# Patient Record
Sex: Male | Born: 1950 | Race: White | Hispanic: No | Marital: Single
Health system: Southern US, Community
[De-identification: ages and names within clinical notes are randomized; demographics above are authoritative.]

## PROBLEM LIST (undated history)

## (undated) DIAGNOSIS — I2510 Atherosclerotic heart disease of native coronary artery without angina pectoris: Secondary | ICD-10-CM

## (undated) DIAGNOSIS — J189 Pneumonia, unspecified organism: Secondary | ICD-10-CM

## (undated) DIAGNOSIS — M1990 Unspecified osteoarthritis, unspecified site: Secondary | ICD-10-CM

## (undated) DIAGNOSIS — D649 Anemia, unspecified: Secondary | ICD-10-CM

## (undated) DIAGNOSIS — I509 Heart failure, unspecified: Secondary | ICD-10-CM

## (undated) DIAGNOSIS — F419 Anxiety disorder, unspecified: Secondary | ICD-10-CM

## (undated) DIAGNOSIS — J449 Chronic obstructive pulmonary disease, unspecified: Secondary | ICD-10-CM

## (undated) HISTORY — PX: HAND SURGERY: SHX662

## (undated) HISTORY — PX: PARTIAL GASTRECTOMY: SHX2172

---

## 2007-04-19 ENCOUNTER — Ambulatory Visit: Payer: Self-pay | Admitting: Thoracic Surgery

## 2007-05-01 ENCOUNTER — Ambulatory Visit (HOSPITAL_COMMUNITY): Admission: RE | Admit: 2007-05-01 | Discharge: 2007-05-01 | Payer: Self-pay | Admitting: Thoracic Surgery

## 2007-05-01 ENCOUNTER — Encounter (INDEPENDENT_AMBULATORY_CARE_PROVIDER_SITE_OTHER): Payer: Self-pay | Admitting: Interventional Radiology

## 2007-05-03 ENCOUNTER — Ambulatory Visit: Payer: Self-pay | Admitting: Thoracic Surgery

## 2007-05-29 ENCOUNTER — Observation Stay (HOSPITAL_COMMUNITY): Admission: RE | Admit: 2007-05-29 | Discharge: 2007-05-29 | Payer: Self-pay | Admitting: Cardiovascular Disease

## 2007-06-30 ENCOUNTER — Ambulatory Visit (HOSPITAL_COMMUNITY): Admission: RE | Admit: 2007-06-30 | Discharge: 2007-06-30 | Payer: Self-pay | Admitting: Cardiovascular Disease

## 2007-07-25 ENCOUNTER — Observation Stay (HOSPITAL_COMMUNITY): Admission: RE | Admit: 2007-07-25 | Discharge: 2007-07-26 | Payer: Self-pay | Admitting: Cardiovascular Disease

## 2010-08-18 NOTE — Cardiovascular Report (Signed)
NAMESEAMUS, WAREHIME NO.:  [id number]   MEDICAL RECORD NO.:  [id number]          PATIENT TYPE:  OBV   LOCATION:  2899                         FACILITY:  [facility]   PHYSICIAN:  Nanetta Batty, M.D.   DATE OF BIRTH:  [birth year]   DATE OF PROCEDURE:  DATE OF DISCHARGE:                            CARDIAC CATHETERIZATION   HISTORY OF PRESENT ILLNESS:  Mr. Sauls is a very pleasant 60 year old  married white male, father of three stepchildren whose aunt is a patient  of mine.  He is referred through the courtesy of Dr. Lorin Picket for  cardiovascular evaluation.  He has been diagnosed with occluded left  subclavian artery as well as lower extremity claudication.  He has a 60  pack year history of tobacco abuse with COPD.  He does get occasional  chest tightness, but dobutamine rest/stress Myoview is negative.  He  does have Doppler evidence of left iliac and left renal artery stenosis.  He presents now for angiography of his subclavian renal and lower  extremity arteries to define his anatomy and potentially perform  endovascular therapy.   DESCRIPTION OF PROCEDURE:  The patient was brought to the second floor  [facility] angiographic suite in the postabsorptive state.  He was  premedicated with p.o. Valium.  His right groin was prepped and shaved  in usual sterile fashion, and 1% Xylocaine was used for local  anesthesia.  A 5-French sheath was inserted into the right femoral  artery using standard Seldinger technique.  A 5-French pigtail catheter  was used for arch angiography, and distal abdominal aortography.  Selective right innominate, right subclavian, left renal artery  angiography were performed.  Visipaque dye was used through the entirety  of the case.  Aortic pressures monitored in the case.   ANGIOGRAPHIC RESULTS:  1. Arterial aortogram; type 1 arch, 99% left subclavian artery      stenosis of the long segment subtotal occlusion.  There is no left  vertebral filling.  The delayed imaging of the left subclavian with      right innominate injection.  2. Abdominal aortogram.  Renal arteries 75% inferior pole accessory      left renal artery stenosis with significant amount of damping.  The      infrarenal abdominal aorta showed moderate atherosclerosis distally      just before the bifurcation.  3. Left lower extremity, 95% ostial left common iliac artery stenosis.  4. Right lower extremity; the right iliac artery is widely patent.   IMPRESSION:  Mr. Metayer has a subtotally occluded left subclavian.  I  cannot see the origin of the left vertebral, and therefore, I did not  feel comfortable exploring or intervening on this at this time.  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  Will continue to follow this noninvasively.  Does  have a high-grade left iliac artery stenosis which I plan on intervening  on in a staged fashion given the amount of contrast used today.   The sheath was removed and pressure applied and achieved good  hemostasis.  The patient left lab in stable condition.  He will be  hydrated for the next 5 hours and remain recumbent and will be  discharged home after that.  Will see him back in the office in  approximately one week for follow-up.      Nanetta Batty, M.D.  Electronically Signed     JB/MEDQ  D:  05/29/2007  T:  05/29/2007  Job:  [id number]   cc:   2nd Floor [facility]  [facility] [place] Dr. Lucila Maine

## 2010-08-18 NOTE — Letter (Signed)
April 19, 2007   Gary [name], M.D.  [address]  [place], [place]. [address]   Re:  Gary, [name]              DOB:  1951-03-11   Dear Dr. Blenda [name]:   I saw the patient back in the office today The patient is disabled  because of severe lung disease, although he continues to smoke and he is  on oxygen.  Pulmonary function tests reveal an FVC of 2.87 or 69% or  predicted with an FEV1 of 1.01 or 30% of predicted.  His DLCO is  markedly reduced at 5% of predicted.  He had a CT scan done in 2007  which showed chronic obstructive pulmonary disease and bilateral pleural  disease, now has a CT with a right upper lobe infiltrative-type lesion  which is present at the right apex.  The bronchoscopy was non  diagnostic.  A PET scan was done which showed some increased uptake of  5.8 in the right upper lobe.  This was done at [facility] which is very  hard for [place] to interpret over here because of their disk that they send.  It is not as user friendly as the [facility] PET scan.  He has had no  hemoptysis, fevers, chills, excessive sputum.   His other medications include Advair, Spiriva, he has been using  intermittent Avelox.  He is on prednisone, folic acid, and albuterol.  He is disabled for severe obstructive pulmonary disease.   FAMILY HISTORY:  Noncontributory.   SOCIAL HISTORY:  He is single and has 3 children, is disabled, smokes 1  pack a day, and drinks alcohol on a regular basis.   REVIEW OF SYSTEMS:  He is 130 pounds, he is 5 feet 9 inches.  He wears  oxygen.  CARDIAC:  He has some chest tightness but no atrial fibrillation or  heart murmur.  PULMONARY:  He has had no hemoptysis, but has been treated for  bronchitis, chronic coughing and wheezing.  GI:  No nausea, vomiting, constipation, or diarrhea.  GU:  No kidney disease, dysuria or frequent urination.  VASCULAR:  He has claudication, DVT or TAs.  NEUROLOGICAL:  No dizziness, headaches, blackouts, seizures.  MUSCULOSKELETAL:  No arthritis or joint pain.  PSYCHIATRIC:  He has been treated for nervousness and depression.  EYES/ENT:  No changes in his eyesight or hearing.  HEMATOLOGICAL:  No problems with bleeding or clotting disorder.   PHYSICAL EXAMINATION:  He is an ill-appearing Caucasian male in no acute  distress.  His blood pressure was 139/79 and pulse 100, respirations 20,  sats were 96% on 2 L of oxygen.  Head, eyes, ears, nose, and throat are  unremarkable.  Neck is supple without thyromegaly.  There is no  supraclavicular or axial adenopathy.  Chest is clear to auscultation and  percussion.  Heart rate is bk , no murmurs.  On the chest there is  decreased breath sounds on the right side, particularly.  Abdomen is  soft, there is no hepatosplenomegaly.  Pulses are 1+, there is no  clubbing or edema.   I discussed the situation with him.  He said that you had mentioned  possibly doing a VATS approach.  I think this would be very hard to do a  biopsy because of his thickened pleura.  He has a calcification in the  pleura.  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  I think the best thing to try is a  needle biopsy, and I will go ahead and schedule him for a needle biopsy  of this right upper lobe mass.  I think the differential, as you  suspected, is either between infiltrative lung cancer versus some type  of chronic inflammatory process, either from atypical TB or fungus.  I  will let you know the findings of our workup.   Gary [name], M.D.  Electronically Signed   DPB/MEDQ  D:  04/19/2007  T:  04/19/2007  Job:  [id number]

## 2010-08-18 NOTE — Cardiovascular Report (Signed)
NAMEBARNABY, RIPPEON NO.:  [id number]   MEDICAL RECORD NO.:  [id number]          PATIENT TYPE:  OUT   LOCATION:  CATS                         FACILITY:  [facility]   PHYSICIAN:  Nanetta Batty, M.D.   DATE OF BIRTH:  1950-04-29   DATE OF PROCEDURE:  DATE OF DISCHARGE:  06/30/2007                            CARDIAC CATHETERIZATION   Gary [name] is a 60 year old married white male, father of three,  referred by Gary [name] for cardiovascular and peripheral vascular  evaluation.  He is found to have left upper and lower extremity stenosis  as well as left renal artery stenosis.  He complains of left upper  extremity claudication and weakness to his left leg.  Angiography  performed on May 29, 2007, revealed 99% long left subclavian  stenosis.  I could not visualize the origin of the vertebrae, and a CT  angiogram suggested that was occluded.  Myoview stress test was  negative.  He presents now for angiography and intervention of his left  subclavian and iliac artery.   PROCEDURE DESCRIPTION:  The patient was brought to the second floor of  [facility]D [facility] Suite in postabsorptive state.  He was  premedicated with IV Versed and fentanyl.  His left groin was prepped  and shaved in usual sterile fashion.  Xylocaine 1% was used for local  anesthesia.  A 7-French 30-cm long bright tip sheath was then inserted  into the left femoral artery using standard Seldinger technique.  The  patient received a total of 4000 units of heparin intravenously.  Visipaque dye was used for the entirety of the case.  Retrograde aortic  pressure was monitored during the case.  A Wholey wire crossed the  iliac, and PTA was performed initially with a 0.062 Powerflex allowing  the sheath to cross without obstructing the vessel.  Following this, a 5-  [place] long right Judkins catheter was used for selective right  vertebral angiography and left vertebral angiography.   ANGIOGRAPHIC  RESULTS:  Right vertebral angiography revealed a 40-50%  ostial stenosis.  The __________  filled  posterior circulation with  efflux into the left basilar artery.  Retrograde flow was not seen down  to the subclavian.  Angiography of the left subclavian revealed to be  99% stenosed.  The [facility] wire would not cross, and therefore a long  0.035 angled Glidewire was used to cross the lesion.  A 135-cm long  Quick-Cross catheter was then used to cross the lesion, and a Rosen wire  was exchanged.  The sheath was then exchanged for a 7-French 30-cm long  bright tip sheath.  Dr. Jacinto Halim assisted during the case.   Using a 4 x 4 Powerflex, predilatation was performed.  The subclavian  was then stented using a EV3 Protege nitinol self-expanding stent (8 x  4) precisely hitting the ostium of the vessel.  This was dilated with a  0.074 Powerflex throughout its entirety, and the ostium was flared with  an 0.082 Powerflex resulting in reduction of 99% stenosis to 0%  residual.  There was antegrade flow up and down the IMA.  The sheath was then withdrawn into the aorta and exchanged for the 30-cm  long 8-French.  The origin of the iliac was then stented with a 7 x 28  OmniLink and postdilated with an 0.082 Powerflex resulting reduction of  90% stenosis to 0% residual.  There was small linear dissection that was  contained within the stent.  The patient tolerated the procedure well.  The guidewire was removed.  ACT was measured at 220.  The sheath will be  removed once the  ACT falls below 170.  The patient left the office in stable condition.  He will be gently hydrated overnight, treated with aspirin and Plavix.  He was discharged home, and we will get upper and lower extremity  Doppler studies, after which he will see me back in followup.  He left  lab in stable condition.      Nanetta Batty, M.D.  Electronically Signed     JB/MEDQ  D:  07/25/2007  T:  07/26/2007  Job:  [id number]   cc:    Lucila Maine, M.D  Second Floor [facility]D angiographic Suite  [facility]

## 2010-08-18 NOTE — Letter (Signed)
May 03, 2007   Tanvir A. Blenda Nicely, M.D.  [address]  [place], [place]. [address]   Re:  Gary [name], Gary [name]              DOB:  Sep 10, 1950   Dear Dr. Blenda Nicely:   I saw Gary [name] back for followup. We did a needle biopsy of the right  upper lobe mass and it turned out to be chronic inflammation with  multiple histocytes and multi-enucleated cells. There is no evidence of  cancer. He is still on oxygen and breathing poorly, but since there is  no evidence of cancer in the needle biopsy, I think we can just safely  follow him and I will see him back again in three months with another CT  scan. I do not think we have any other options given his lung capacity.  His blood pressure was 135/77, pulse 100, respirations 22, sats were 92%  on oxygen. Lungs showed distant breath sounds.   Ines Bloomer, M.D.  Electronically Signed   DPB/MEDQ  D:  05/03/2007  T:  05/03/2007  Job:  [id number]

## 2010-08-21 NOTE — Discharge Summary (Signed)
Gary [name], Gary [name] NO.:  [id number]   MEDICAL RECORD NO.:  [id number]          PATIENT TYPE:  OBV   LOCATION:  6525                         FACILITY:  [facility]   PHYSICIAN:  Gary [name], M.D.   DATE OF BIRTH:  May 27, 1950   DATE OF ADMISSION:  07/25/2007  DATE OF DISCHARGE:  07/26/2007                               DISCHARGE SUMMARY   DISCHARGE DIAGNOSES:  1. Peripheral vascular disease, status post elective left subclavian      and left common iliac artery angioplasty and stenting this      admission.  2. History of smoking.  3. Negative Myoview with normal LV function, January 2009.  4. Residual 75% left renal artery stenosis.   HOSPITAL COURSE:  Gary [name] is a 60 year old male followed by Dr. Lorin [name]  whose aunt sees Dr. Allyson [name] as well.  Gary [name] was referred back to Dr.  Allyson [name] for peripheral vascular evaluation.  He was found to have left  upper and lower extremity stenosis as well as left renal artery  stenosis.  He does have left upper extremity claudication and weakness  in his left leg.  Angiography performed in February 2009 revealed a 99%  stenosis of his left subclavian, 75% accessory left renal artery  narrowing, and a 90% stenosis of the left common iliac.  CT scan was  done as an outpatient.  He was seen by Dr. Allyson [name] on July 20, 2007 and  Dr. Allyson [name] felt it would be best to proceed with elective intervention.  The patient was admitted on July 25, 2007 for peripheral angiogram.  This showed 90% left common iliac artery, 99% left subclavian artery,  and 40-50% right vertebral artery.  The patient underwent intervention  to the subclavian and common iliac on the left, tolerated this well.  I  felt he could be discharged on July 26, 2007.  He will follow up with  Dr. Allyson [name] after discharge in a couple weeks.   DISCHARGE MEDICATIONS:  1. DuoNeb as needed.  2. Spiriva 18 mcg a day.  3. Advair Diskus 250/50 b.i.d.  4. Folic acid.  5. Aspirin 325  mg a day.  6. Plavix 75 mg a day.   Labs done preop show white count of 5.3, hemoglobin 14, hematocrit 39.5,  and platelets 239.  INR 0.8, BUN 11, creatinine 1.2, sodium 140, and  potassium 4.4.  Urinalysis is unremarkable.  TSH 2.1.  EKG shows sinus  rhythm without acute changes.   DISPOSITION:  The patient is discharged in stable condition and will  follow up with Dr. Allyson [name] as in outpatient.      Abelino Derrick, P.A.      Gary [name], M.D.  Electronically Signed    LKK/MEDQ  D:  08/23/2007  T:  08/24/2007  Job:  [id number]   cc:   Heywood Bene, MD

## 2010-12-25 LAB — BASIC METABOLIC PANEL
BUN: 10
Chloride: 102
GFR calc non Af Amer: >60
Glucose, Bld: 74
Potassium: 4.1

## 2010-12-25 LAB — CBC
HCT: 41.2
Hemoglobin: 13.7
MCHC: 33.2
MCV: 96.1
RDW: 15.8 — ABNORMAL HIGH

## 2010-12-29 LAB — CBC
MCHC: 34.3
MCV: 94.8
Platelets: 218

## 2010-12-29 LAB — BASIC METABOLIC PANEL
BUN: 9
CO2: 27
Calcium: 9.3
Creatinine, Ser: 1.32
Glucose, Bld: 91

## 2014-03-20 ENCOUNTER — Inpatient Hospital Stay (HOSPITAL_COMMUNITY): Payer: Medicare FFS

## 2014-03-20 ENCOUNTER — Inpatient Hospital Stay (HOSPITAL_COMMUNITY)
Admission: EM | Admit: 2014-03-20 | Discharge: 2014-04-05 | DRG: 871 | Disposition: E | Payer: Medicare FFS | Source: Other Acute Inpatient Hospital | Attending: Internal Medicine | Admitting: Internal Medicine

## 2014-03-20 ENCOUNTER — Encounter (HOSPITAL_COMMUNITY): Payer: Self-pay | Admitting: Acute Care

## 2014-03-20 DIAGNOSIS — M1990 Unspecified osteoarthritis, unspecified site: Secondary | ICD-10-CM | POA: Diagnosis present

## 2014-03-20 DIAGNOSIS — J441 Chronic obstructive pulmonary disease with (acute) exacerbation: Secondary | ICD-10-CM | POA: Diagnosis present

## 2014-03-20 DIAGNOSIS — Z681 Body mass index (BMI) 19 or less, adult: Secondary | ICD-10-CM

## 2014-03-20 DIAGNOSIS — J156 Pneumonia due to other aerobic Gram-negative bacteria: Secondary | ICD-10-CM | POA: Diagnosis present

## 2014-03-20 DIAGNOSIS — G9341 Metabolic encephalopathy: Secondary | ICD-10-CM | POA: Diagnosis present

## 2014-03-20 DIAGNOSIS — I25119 Atherosclerotic heart disease of native coronary artery with unspecified angina pectoris: Secondary | ICD-10-CM

## 2014-03-20 DIAGNOSIS — Y95 Nosocomial condition: Secondary | ICD-10-CM | POA: Diagnosis present

## 2014-03-20 DIAGNOSIS — E872 Acidosis: Secondary | ICD-10-CM | POA: Diagnosis present

## 2014-03-20 DIAGNOSIS — Z7952 Long term (current) use of systemic steroids: Secondary | ICD-10-CM | POA: Diagnosis not present

## 2014-03-20 DIAGNOSIS — Z515 Encounter for palliative care: Secondary | ICD-10-CM

## 2014-03-20 DIAGNOSIS — A419 Sepsis, unspecified organism: Secondary | ICD-10-CM | POA: Diagnosis present

## 2014-03-20 DIAGNOSIS — I2510 Atherosclerotic heart disease of native coronary artery without angina pectoris: Secondary | ICD-10-CM | POA: Diagnosis present

## 2014-03-20 DIAGNOSIS — R739 Hyperglycemia, unspecified: Secondary | ICD-10-CM | POA: Diagnosis present

## 2014-03-20 DIAGNOSIS — J189 Pneumonia, unspecified organism: Secondary | ICD-10-CM

## 2014-03-20 DIAGNOSIS — E43 Unspecified severe protein-calorie malnutrition: Secondary | ICD-10-CM | POA: Diagnosis present

## 2014-03-20 DIAGNOSIS — R6521 Severe sepsis with septic shock: Secondary | ICD-10-CM | POA: Diagnosis present

## 2014-03-20 DIAGNOSIS — J9621 Acute and chronic respiratory failure with hypoxia: Secondary | ICD-10-CM | POA: Diagnosis present

## 2014-03-20 DIAGNOSIS — J439 Emphysema, unspecified: Secondary | ICD-10-CM

## 2014-03-20 DIAGNOSIS — J9622 Acute and chronic respiratory failure with hypercapnia: Secondary | ICD-10-CM | POA: Diagnosis present

## 2014-03-20 DIAGNOSIS — J9620 Acute and chronic respiratory failure, unspecified whether with hypoxia or hypercapnia: Secondary | ICD-10-CM

## 2014-03-20 DIAGNOSIS — F419 Anxiety disorder, unspecified: Secondary | ICD-10-CM | POA: Diagnosis present

## 2014-03-20 DIAGNOSIS — E875 Hyperkalemia: Secondary | ICD-10-CM | POA: Diagnosis present

## 2014-03-20 DIAGNOSIS — R0602 Shortness of breath: Secondary | ICD-10-CM | POA: Diagnosis present

## 2014-03-20 DIAGNOSIS — D649 Anemia, unspecified: Secondary | ICD-10-CM | POA: Diagnosis present

## 2014-03-20 DIAGNOSIS — I2609 Other pulmonary embolism with acute cor pulmonale: Secondary | ICD-10-CM | POA: Diagnosis present

## 2014-03-20 DIAGNOSIS — G9340 Encephalopathy, unspecified: Secondary | ICD-10-CM

## 2014-03-20 DIAGNOSIS — Z66 Do not resuscitate: Secondary | ICD-10-CM

## 2014-03-20 DIAGNOSIS — Z9981 Dependence on supplemental oxygen: Secondary | ICD-10-CM

## 2014-03-20 DIAGNOSIS — J432 Centrilobular emphysema: Secondary | ICD-10-CM

## 2014-03-20 HISTORY — DX: Anemia, unspecified: D64.9

## 2014-03-20 HISTORY — DX: Anxiety disorder, unspecified: F41.9

## 2014-03-20 HISTORY — DX: Atherosclerotic heart disease of native coronary artery without angina pectoris: I25.10

## 2014-03-20 HISTORY — DX: Heart failure, unspecified: I50.9

## 2014-03-20 HISTORY — DX: Pneumonia, unspecified organism: J18.9

## 2014-03-20 HISTORY — DX: Chronic obstructive pulmonary disease, unspecified: J44.9

## 2014-03-20 HISTORY — DX: Unspecified osteoarthritis, unspecified site: M19.90

## 2014-03-20 LAB — CBC WITH DIFFERENTIAL/PLATELET
Basophils Absolute: 0 10*3/uL (ref 0.0–0.1)
Basophils Relative: 0 % (ref 0–1)
EOS PCT: 0 % (ref 0–5)
Eosinophils Absolute: 0 10*3/uL (ref 0.0–0.7)
HEMATOCRIT: 22.5 % — AB (ref 39.0–52.0)
Hemoglobin: 6.7 g/dL — CL (ref 13.0–17.0)
Lymphocytes Relative: 5 % — ABNORMAL LOW (ref 12–46)
Lymphs Abs: 0.4 10*3/uL — ABNORMAL LOW (ref 0.7–4.0)
MCH: 29.8 pg (ref 26.0–34.0)
MCHC: 29.8 g/dL — ABNORMAL LOW (ref 30.0–36.0)
MCV: 100 fL (ref 78.0–100.0)
Monocytes Absolute: 0.1 10*3/uL (ref 0.1–1.0)
Monocytes Relative: 1 % — ABNORMAL LOW (ref 3–12)
NEUTROS PCT: 94 % — AB (ref 43–77)
NRBC: 10 /100{WBCs} — AB
Neutro Abs: 7.3 10*3/uL (ref 1.7–7.7)
Platelets: 161 10*3/uL (ref 150–400)
RBC: 2.25 MIL/uL — AB (ref 4.22–5.81)
RDW: 20.3 % — ABNORMAL HIGH (ref 11.5–15.5)
WBC: 7.8 10*3/uL (ref 4.0–10.5)

## 2014-03-20 LAB — COMPREHENSIVE METABOLIC PANEL
ALBUMIN: 1 g/dL — AB (ref 3.5–5.2)
ALK PHOS: 84 U/L (ref 39–117)
ALT: 18 U/L (ref 0–53)
ANION GAP: 9 (ref 5–15)
AST: 25 U/L (ref 0–37)
BILIRUBIN TOTAL: 0.3 mg/dL (ref 0.3–1.2)
BUN: 39 mg/dL — AB (ref 6–23)
CHLORIDE: 107 meq/L (ref 96–112)
CO2: 26 meq/L (ref 19–32)
CREATININE: 0.71 mg/dL (ref 0.50–1.35)
Calcium: 6.6 mg/dL — ABNORMAL LOW (ref 8.4–10.5)
GFR calc Af Amer: >90 mL/min (ref >90)
GFR calc non Af Amer: >90 mL/min (ref >90)
Glucose, Bld: 168 mg/dL — ABNORMAL HIGH (ref 70–99)
Potassium: 5.2 mEq/L (ref 3.7–5.3)
Sodium: 142 mEq/L (ref 137–147)
Total Protein: 3.2 g/dL — ABNORMAL LOW (ref 6.0–8.3)

## 2014-03-20 LAB — BLOOD GAS, ARTERIAL
ACID-BASE EXCESS: 0 mmol/L (ref 0.0–2.0)
Bicarbonate: 26.1 mEq/L — ABNORMAL HIGH (ref 20.0–24.0)
Drawn by: 24610
FIO2: 0.8 %
O2 Saturation: 21.5 %
PEEP: 5 cmH2O
Patient temperature: 97.3
RATE: 20 resp/min
TCO2: 27.9 mmol/L (ref 0–100)
VT: 580 mL
pCO2 arterial: 56.2 mmHg — ABNORMAL HIGH (ref 35.0–45.0)
pH, Arterial: 7.284 — ABNORMAL LOW (ref 7.350–7.450)

## 2014-03-20 LAB — HEMOGLOBIN A1C
Hgb A1c MFr Bld: 6.7 % — ABNORMAL HIGH (ref <5.7)
MEAN PLASMA GLUCOSE: 146 mg/dL — AB (ref <117)

## 2014-03-20 LAB — PROTIME-INR
INR: 1.28 (ref 0.00–1.49)
PROTHROMBIN TIME: 16.1 s — AB (ref 11.6–15.2)

## 2014-03-20 LAB — PRO B NATRIURETIC PEPTIDE: PRO B NATRI PEPTIDE: 4984 pg/mL — AB (ref 0–125)

## 2014-03-20 LAB — TROPONIN I: Troponin I: <0.3 ng/mL (ref <0.30)

## 2014-03-20 LAB — GLUCOSE, CAPILLARY
GLUCOSE-CAPILLARY: 108 mg/dL — AB (ref 70–99)
Glucose-Capillary: 130 mg/dL — ABNORMAL HIGH (ref 70–99)

## 2014-03-20 LAB — LACTIC ACID, PLASMA: Lactic Acid, Venous: 3.9 mmol/L — ABNORMAL HIGH (ref 0.5–2.2)

## 2014-03-20 LAB — STREP PNEUMONIAE URINARY ANTIGEN: Strep Pneumo Urinary Antigen: NEGATIVE

## 2014-03-20 LAB — TYPE AND SCREEN
ABO/RH(D): O POS
Antibody Screen: POSITIVE
DAT, IgG: NEGATIVE
PT AG Type: NEGATIVE

## 2014-03-20 LAB — PROCALCITONIN: PROCALCITONIN: 53.81 ng/mL

## 2014-03-20 LAB — MAGNESIUM: Magnesium: 3.1 mg/dL — ABNORMAL HIGH (ref 1.5–2.5)

## 2014-03-20 LAB — APTT: aPTT: 33 seconds (ref 24–37)

## 2014-03-20 LAB — MRSA PCR SCREENING: MRSA BY PCR: NEGATIVE

## 2014-03-20 LAB — PHOSPHORUS: Phosphorus: 3.2 mg/dL (ref 2.3–4.6)

## 2014-03-20 MED ORDER — MORPHINE SULFATE 2 MG/ML IJ SOLN: 2.0000 mg | INTRAMUSCULAR | Status: DC | PRN

## 2014-03-20 MED ORDER — PRO-STAT SUGAR FREE PO LIQD
30.0000 mL | Freq: Two times a day (BID) | ORAL | Status: DC
  Filled 2014-03-20: qty 30

## 2014-03-20 MED ORDER — VITAL HIGH PROTEIN PO LIQD
1000.0000 mL | ORAL | Status: DC
  Filled 2014-03-20 (×2): qty 1000

## 2014-03-20 MED ORDER — HYDROCORTISONE NA SUCCINATE PF 100 MG IJ SOLR
50.0000 mg | Freq: Four times a day (QID) | INTRAMUSCULAR | Status: DC
  Filled 2014-03-20 (×3): qty 1

## 2014-03-20 MED ORDER — DEXTROSE 5 % IV SOLN
2.0000 g | Freq: Three times a day (TID) | INTRAVENOUS | Status: DC
  Filled 2014-03-20 (×2): qty 2

## 2014-03-20 MED ORDER — NOREPINEPHRINE BITARTRATE 1 MG/ML IV SOLN
2.0000 ug/min | INTRAVENOUS | Status: DC
  Administered 2014-03-20: 50 ug/min via INTRAVENOUS
  Filled 2014-03-20: qty 4

## 2014-03-20 MED ORDER — BUDESONIDE 0.5 MG/2ML IN SUSP
0.5000 mg | Freq: Two times a day (BID) | RESPIRATORY_TRACT | Status: DC
  Filled 2014-03-20 (×2): qty 2

## 2014-03-20 MED ORDER — HEPARIN SODIUM (PORCINE) 5000 UNIT/ML IJ SOLN: 5000.0000 [IU] | Freq: Three times a day (TID) | INTRAMUSCULAR | Status: DC

## 2014-03-20 MED ORDER — PANTOPRAZOLE SODIUM 40 MG IV SOLR: 40.0000 mg | Freq: Every day | INTRAVENOUS | Status: DC

## 2014-03-20 MED ORDER — SODIUM CHLORIDE 0.9 % IV SOLN
0.0300 [IU]/min | INTRAVENOUS | Status: DC
  Administered 2014-03-20: 0.03 [IU]/min via INTRAVENOUS
  Filled 2014-03-20: qty 2

## 2014-03-20 MED ORDER — ARFORMOTEROL TARTRATE 15 MCG/2ML IN NEBU
15.0000 ug | INHALATION_SOLUTION | Freq: Two times a day (BID) | RESPIRATORY_TRACT | Status: DC
  Administered 2014-03-20: 15 ug via RESPIRATORY_TRACT
  Filled 2014-03-20 (×2): qty 2

## 2014-03-20 MED ORDER — LEVOFLOXACIN IN D5W 750 MG/150ML IV SOLN: 750.0000 mg | INTRAVENOUS | Status: DC

## 2014-03-20 MED ORDER — FENTANYL CITRATE 0.05 MG/ML IJ SOLN: 50.0000 ug | Freq: Once | INTRAMUSCULAR | Status: DC

## 2014-03-20 MED ORDER — SODIUM CHLORIDE 0.9 % IV SOLN
INTRAVENOUS | Status: DC
  Administered 2014-03-20: 17:00:00 via INTRAVENOUS

## 2014-03-20 MED ORDER — NOREPINEPHRINE BITARTRATE 1 MG/ML IV SOLN
0.0000 ug/min | INTRAVENOUS | Status: DC
  Filled 2014-03-20: qty 16

## 2014-03-20 MED ORDER — FENTANYL BOLUS VIA INFUSION
50.0000 ug | INTRAVENOUS | Status: DC | PRN
  Filled 2014-03-20: qty 50

## 2014-03-20 MED ORDER — CETYLPYRIDINIUM CHLORIDE 0.05 % MT LIQD: 7.0000 mL | Freq: Four times a day (QID) | OROMUCOSAL | Status: DC

## 2014-03-20 MED ORDER — VITAL AF 1.2 CAL PO LIQD
1000.0000 mL | ORAL | Status: DC
  Filled 2014-03-20 (×2): qty 1000

## 2014-03-20 MED ORDER — CHLORHEXIDINE GLUCONATE 0.12 % MT SOLN: 15.0000 mL | Freq: Two times a day (BID) | OROMUCOSAL | Status: DC

## 2014-03-20 MED ORDER — INSULIN ASPART 100 UNIT/ML ~~LOC~~ SOLN: 0.0000 [IU] | SUBCUTANEOUS | Status: DC

## 2014-03-20 MED ORDER — FENTANYL CITRATE 0.05 MG/ML IJ SOLN
25.0000 ug/h | INTRAMUSCULAR | Status: DC
  Administered 2014-03-20: 50 ug/h via INTRAVENOUS
  Filled 2014-03-20: qty 50

## 2014-03-20 MED ORDER — PRO-STAT SUGAR FREE PO LIQD
30.0000 mL | Freq: Two times a day (BID) | ORAL | Status: DC
  Filled 2014-03-20 (×2): qty 30

## 2014-03-20 MED ORDER — SODIUM CHLORIDE 0.9 % IV SOLN: 250.0000 mL | INTRAVENOUS | Status: DC | PRN

## 2014-03-21 LAB — LEGIONELLA ANTIGEN, URINE

## 2014-03-21 LAB — URINE CULTURE
Colony Count: NO GROWTH
Culture: NO GROWTH
Special Requests: NORMAL

## 2014-03-22 LAB — PATHOLOGIST SMEAR REVIEW

## 2014-03-25 LAB — CULTURE, RESPIRATORY: Special Requests: NORMAL

## 2014-03-25 LAB — CULTURE, RESPIRATORY W GRAM STAIN

## 2014-03-26 LAB — CULTURE, BLOOD (ROUTINE X 2): CULTURE: NO GROWTH

## 2014-03-27 NOTE — Discharge Summary (Signed)
Gary [name] was a 63 y.o. male [facility] on 05[facility]Nov-2013.  He had progressive cough and dyspnea from AECOPD and PNA.  He required intubation for respiratory failure and pressors for septic shock.  He was continued on broad spectrum antibiotics.  [facility] held.  Gary [name] had very poor quality of life prior to this illness.  Family agreed to DNR status.  After further discussion, the family came to accept that he could not survive this illness.  They opted to implement comfort measures only.  He was removed from ventilatory support and subsequently expired on 05-Nov-2013 at 1942.  Final Diagnoses: 1) acute on chronic hypoxic/hypercapneic respiratory failure 2) HCAP with Enterobacter cloacae, and Serratia marcescens 3) AECOPD 4) Septic shock 5) Hx of CAD 6) Hyperkalemia 7) severe protein calorie malnutrition 8) anemia of critical illness 9) hyperglycemia 10) acute metabolic encephalopathy 11) Lactic acidosis 12) Tobacco abuse 13) acute on chronic cor pulmonale  Gary HellingVineet Jazir Newey, MD [facility] Pulmonary/Critical Care 03/27/2014, 11:16 AM

## 2014-04-05 NOTE — Progress Notes (Signed)
150 cc fentanyl wasted in sink with Jodene NamMonica Cross, RN

## 2014-04-05 NOTE — Progress Notes (Signed)
Maggie with lab called a critical hemoglobin of 6.7. Value readback and affirmed. Value given to Dr. Craige CottaSood. Will continue with current plan of care.

## 2014-04-05 NOTE — Progress Notes (Signed)
eLink Physician-Brief Progress Note Patient Name: Bland SpanDarrell P Coen DOB: 11/02/1950 MRN: [id number]   Date of Service  March 30, 2014  HPI/Events of Note  Order for levophed written (was on this on arrival from OSH)  eICU Interventions       Intervention Category Major Interventions: Shock - evaluation and management  MCQUAID, DOUGLAS March 30, 2014, 4:37 PM

## 2014-04-05 NOTE — Significant Event (Signed)
Met with pt's mother and sister.  Updated them about pt's condition.  They understand the severity of his illnesses, and understand he might not survive this illness.  They would like to continue current therapies, but agree to not escalate care if his medical condition gets worse.  They also confirmed DNR status.  Chesley Mires, MD [facility] Pulmonary/Critical Care 04/16/14, 4:44 PM Pager:  [phone] After 3pm call: [phone]

## 2014-04-05 NOTE — H&P (Signed)
PULMONARY / CRITICAL CARE MEDICINE   Name: Gary [name] MRN: [id number] DOB: 05/11/1950    ADMISSION DATE:  2013-12-05  REFERR[facility] MD :  [facility]   CHIEF COMPLAINT:  Acute on chronic respiratory failure   INITIAL PRESENTATION:  This is a 64 year old male w/ known h/o O2 and steroid dependent COPD, who [facility]16 w/ CC: 1 d h/o cough and progressive shortness of breath. EMS called. On arrival he was agonal and therefore intubated. Initial CXR showed bilateral infiltrates, [facility] for further evaluation and treatment.   STUDIES:    SIGNIFICANT EVENTS:    HISTORY OF PRESENT ILLNESS:   See above   PAST MEDICAL HISTORY :   has a past medical history of CAD (coronary artery disease); CHF (congestive heart failure); COPD, very severe; Pneumonia; Osteoarthritis; Anxiety; and Anemia.  has past surgical history that includes Hand surgery and Partial gastrectomy. Prior to Admission medications      Allergies not on file  FAMILY HISTORY:  has no family status information on file.  SOCIAL HISTORY:    REVIEW OF SYSTEMS:  Unable   SUBJECTIVE:   VITAL SIGNS: Temp:  [97 F (36.1 C)] 97 F (36.1 C) (12/16 1457) FiO2 (%):  [80 %] 80 % (12/16 1453) HEMODYNAMICS:   VENTILATOR SETTINGS: Vent Mode:  [-] PRVC FiO2 (%):  [80 %] 80 % Set Rate:  [20 bmp] 20 bmp Vt Set:  [400 mL] 400 mL PEEP:  [5 cmH20] 5 cmH20 Plateau Pressure:  [16 cmH20] 16 cmH20 INTAKE / OUTPUT: No intake or output data in the 24 hours ending 08/11/13 1515  PHYSICAL EXAMINATION: General:  Chronically ill appearing 64 year old male who appears much older than stated age Neuro:  Opens eyes to voice, nods appropriately, generalized weakness  HEENT:  Poor dentition, no clear JVD Cardiovascular:  rrr Lungs: barreled chest.  Rales R>L base. No sig wheeze.  Abdomen:  Non-tender, + bowel sounds, no OM  Musculoskeletal:  Muscle weakness. Frail  Skin:  Diffuse ecchymosis  over entire body w/ multiple skin tears. Both feet are mottled and cold to touch. Unable to palpate pulses.   LABS:  CBC No results for input(s): WBC, HGB, HCT, PLT in the last 168 hours. Coag's No results for input(s): APTT, INR in the last 168 hours. BMET No results for input(s): NA, K, CL, CO2, BUN, CREATININE, GLUCOSE in the last 168 hours. Electrolytes No results for input(s): CALCIUM, MG, PHOS in the last 168 hours. Sepsis Markers No results for input(s): LATICACIDVEN, PROCALCITON, O2SATVEN in the last 168 hours. ABG No results for input(s): PHART, PCO2ART, PO2ART in the last 168 hours. Liver Enzymes No results for input(s): AST, ALT, ALKPHOS, BILITOT, ALBUMIN in the last 168 hours. Cardiac Enzymes No results for input(s): TROPONINI, PROBNP in the last 168 hours. Glucose  Recent Labs Lab 08/11/13 1454  GLUCAP 130*    Imaging No results found.   ASSESSMENT / PLAN:  PULMONARY OETT 12/16>>> A: Acute on chronic hypoxic/hypercarbic respiratory failure in setting of bilateral pulmonary infiltrates  CAP ES COPD  P:   Full vent support  Scheduled BDs w/ ICS See ID section  PAD protocol   CARDIOVASCULAR CVL right fem CVL 12/16>>>  A: Circulatory shock. Presumed sepsis. Also consider element of volume depletion, +/- vent interaction  P:  Repeat lactic acid, will speak w/ family re: how aggressive we should be. If LA elevated may need to consider IJ to assure adequate  volume resuscitation efforts.  Cont tele Cont IVFs Ck lactic acid  Cont levophed for MAP > 65 Stress dose steroids  RENAL A: possible hyperkalemia (labs note hemolysis) P:   Cont IVFs Repeat chemistry   GASTROINTESTINAL A:   Probable protein cal malnutrition  P:   Start tube feeds Nutritional consult HEMATOLOGIC A:   Anemia of unclear chronicity  P:  Ck Occult blood Trend CBC scds  INFECTIOUS A:   R/o CAP P:   BCx2  12/16>>> UC 12/16>>> Sputum 12/16>>> RVP 12/16>>> Abx:  azactam 12/16>>> ABX levaquin 12/16>>>  ENDOCRINE A:   Hyperglycemia  P:   ssi   NEUROLOGIC A:   Acute encephalopathy  P:   RASS goal: -2 PAD protocol    FAMILY  - Updates:   - Inter-disciplinary family meet or Palliative Care meeting due by: 12/23    TODAY'S NP SUMMARY:  This is a 64 year old male w/ chronic resp failure in setting of copd. He is O2 and pred dependent at baseline. Admitting to MICU w/ what appears to be acute on chronic hypoxic and hypercarbic resp failure in setting of what is likely a CAP. He is also in shock, Presume sepsis. Appears chronically ill. Not sure he will survive this illness. Will cont full vent support, pan culture, cont IVFs, pressors and add stress dose steroids all in addition to empiric abx. Will need goals of care discussion ASAP.   Simonne MartinetPeter E Babcock ACNP-BC [facility] Pulmonary/Critical Care Pager # [phone] OR # [phone] if no answer      2013/12/03, 3:15 PM   Reviewed above, examined.  64 [facility] with PNA, VDRF, and septic shock.  He has hx of severe COPD with emphysema, and severe protein calorie malnutrition.    He remains on vent.  He has decreased breath sounds, very diminished muscle mass, mottled extremities.  He remains on full vent support and pressors.  He remains in extremis, with persistent hypotension.    Discussed with pt's sister/POA Tresa Endo(Kelly) over the phone.  She is in agreement to DNR status in the event of cardiac arrest, and maintain current therapies for now but not escalate care.  He is not likely to survive this illness.  Will continue to update family about goals of care, but most likely best option will be to transition to comfort measures.   CC time by me independent of APP and resident time is 40 minutes.  Coralyn HellingVineet Dianelys Scinto, MD [facility] Pulmonary/Critical Care 2013/12/03, 3:49 PM Pager:  [phone] After 3pm call: [phone]

## 2014-04-05 NOTE — Progress Notes (Addendum)
Pt is in refractory shock. Had goals of care discussion over phone w/ his sister Urbano HeirKelly Julian [phone], his POA. Told her that she is actively dying in spite of current interventions. She verbalized an understanding and agreed to DNR status.  Plan Continue current full vent support Continue pressors, volume resuscitation, and stress dose steroids Repeat admitting labs Cont abx  DNR  She is aware that he may not survive the afternoon.   Simonne MartinetPeter E Tyra Michelle ACNP-BC [facility] Pulmonary/Critical Care Pager # [phone] OR # [phone] if no answer

## 2014-04-05 NOTE — Progress Notes (Signed)
UR Completed.  [phone]  

## 2014-04-05 NOTE — Progress Notes (Signed)
Attempted femoral ABG x 2 RT's.  Results called to Dr. Kendrick FriesMcQuaid d/t ? Venous results.  Per MD, these results are venous.  Per MD, no need to attempt ABG again, no new vent orders rec'd.

## 2014-04-05 NOTE — Progress Notes (Signed)
Patient went into [facility] at 1942.  No pulse palpable.  Pronounced by Lasandra BeechAyana Adir Schicker, RN and Jodene NamMonica Cross, RN.  Sister and brother in law at bedside.  MD notified.

## 2014-04-05 NOTE — Progress Notes (Signed)
INITIAL NUTRITION ASSESSMENT  DOCUMENTATION CODES Per approved criteria  -Severe malnutrition in the context of chronic illness   Pt meets criteria for severe MALNUTRITION in the context of chronic illness as evidenced by severe depletion of muscle and subcutaneous fat mass.  INTERVENTION:  Initiate TF via OGT with Vital AF 1.2 at 25 ml/h and Prostat 30 ml BID on day 1; on day 2, d/c Prostat and increase to goal rate of 50 ml/h (1200 ml per day) to provide 1440 kcals, 90 gm protein, 973 ml free water daily. Monitor magnesium, potassium, and phosphorus daily for at least 3 days, MD to replete as needed, as pt is at risk for refeeding syndrome given severe PCM.  NUTRITION DIAGNOSIS: Inadequate oral intake related to inability to eat as evidenced by NPO status.   Goal: Intake to meet >90% of estimated nutrition needs.  Monitor:  TF tolerance/adequacy, weight trend, labs, vent status.  Reason for Assessment: MD Consult for TF initiation and management.  64 y.o. male  Admitting Dx: Acute on chronic respiratory failure  ASSESSMENT: 64 year old male w/ known h/o O2 and steroid dependent COPD, who [facility] w/ CC: 1 d h/o cough and progressive shortness of breath. On arrival he was intubated. Initial CXR showed bilateral infiltrates, [facility] and [facility].   Nutrition Focused Physical Exam:  Subcutaneous Fat:  Orbital Region: NA Upper Arm Region: severe depletion Thoracic and Lumbar Region: NA  Muscle:  Temple Region: moderate depletion Clavicle Bone Region: mild depletion Clavicle and Acromion Bone Region: mild depletion Scapular Bone Region: NA Dorsal Hand: NA Patellar Region: severe depletion Anterior Thigh Region: severe depletion Posterior Calf Region: severe depletion  Edema: none  Patient is currently intubated on ventilator support MV: 9.8 L/min Temp (24hrs), Avg:97 F (36.1 C), Min:97 F (36.1 C),  Max:97 F (36.1 C)   Height: Ht Readings from Last 1 Encounters:  No data found for Ht  RN to measure patient today. Estimate 5'6"  Weight: Wt Readings from Last 1 Encounters:  11/14/2013 98 lb 8 oz (44.679 kg)    Ideal Body Weight: 64.5 kg  % Ideal Body Weight: 69%  Wt Readings from Last 10 Encounters:  11/14/2013 98 lb 8 oz (44.679 kg)    Usual Body Weight: unknown  % Usual Body Weight: N/A  BMI:  There is no height on file to calculate BMI.  Estimated Nutritional Needs: Kcal: 1415 Protein: 75-85 gm Fluid: 1.5 L  Skin: no issues documented  Diet Order: Diet NPO time specified  EDUCATION NEEDS: -Education not appropriate at this time  No intake or output data in the 24 hours ending 11/14/2013 1617  Last BM: NA   Labs:  No results for input(s): NA, K, CL, CO2, BUN, CREATININE, CALCIUM, MG, PHOS, GLUCOSE in the last 168 hours.  CBG (last 3)   Recent Labs  11/14/2013 1454  GLUCAP 130*    Scheduled Meds: . antiseptic oral rinse  7 mL Mouth Rinse QID  . arformoterol  15 mcg Nebulization Q12H  . aztreonam  2 g Intravenous Q8H  . budesonide (PULMICORT) nebulizer solution  0.5 mg Nebulization BID  . chlorhexidine  15 mL Mouth Rinse BID  . feeding supplement (PRO-STAT SUGAR FREE 64)  30 mL Per Tube BID  . feeding supplement (VITAL HIGH PROTEIN)  1,000 mL Per Tube Q24H  . fentaNYL  50 mcg Intravenous Once  . heparin  5,000 Units Subcutaneous 3 times per day  .  hydrocortisone sod succinate (SOLU-CORTEF) inj  50 mg Intravenous Q6H  . insulin aspart  0-15 Units Subcutaneous 6 times per day  . levofloxacin (LEVAQUIN) IV  750 mg Intravenous Q24H  . pantoprazole (PROTONIX) IV  40 mg Intravenous QHS    Continuous Infusions: . sodium chloride    . fentaNYL infusion INTRAVENOUS 50 mcg/hr (10-01-13 1611)  . vasopressin (PITRESSIN) infusion - *FOR SHOCK* 0.03 Units/min (10-01-13 1607)    Past Medical History  Diagnosis Date  . CAD (coronary artery disease)   . CHF  (congestive heart failure)   . COPD, very severe     o2 and steroid dependent   . Pneumonia   . Osteoarthritis   . Anxiety   . Anemia     Past Surgical History  Procedure Laterality Date  . Hand surgery    . Partial gastrectomy      Joaquin CourtsKimberly Tiffanny Lamarche, RD, LDN, CNSC Pager [phone] After Hours Pager [phone]

## 2014-04-05 NOTE — Progress Notes (Signed)
Sputum sample collected, labeled and sent to lab w/ appropriate requisition.

## 2014-04-05 NOTE — Progress Notes (Signed)
eLink Physician-Brief Progress Note Patient Name: Gary [name] DOB: 04/08/1950 MRN: [id number]   Date of Service  09/04/2013  HPI/Events of Note  Lengthy discussion with nursing and family, chart reviewed. The patient has severe septic shock, poor baseline condition and near zero chance of survival.  Family requests withdrawal of care.  eICU Interventions  Withdraw care now, stop vasopressors, then extubate. Prn morphine ordered     Intervention Category Major Interventions: End of life / care limitation discussion  Karaline Buresh 09/04/2013, 6:41 PM

## 2014-04-05 NOTE — Progress Notes (Signed)
ANTIBIOTIC CONSULT NOTE - INITIAL  Pharmacy Consult for Aztreonam, Levaquin Indication: rule out sepsis  Allergies  Allergen Reactions  . Keflex [Cephalexin]     Patient Measurements:    Vital Signs: Temp: 97 F (36.1 C) (12/16 1457) Temp Source: Oral (12/16 1457) Intake/Output from previous day:   Intake/Output from this shift:    Labs: No results for input(s): WBC, HGB, PLT, LABCREA, CREATININE in the last 72 hours. CrCl cannot be calculated (Unknown ideal weight.). No results for input(s): VANCOTROUGH, VANCOPEAK, VANCORANDOM, GENTTROUGH, GENTPEAK, GENTRANDOM, TOBRATROUGH, TOBRAPEAK, TOBRARND, AMIKACINPEAK, AMIKACINTROU, AMIKACIN in the last 72 hours.   Microbiology: No results found for this or any previous visit (from the past 720 hour(s)).  Medical History: Past Medical History  Diagnosis Date  . CAD (coronary artery disease)   . CHF (congestive heart failure)   . COPD, very severe     o2 and steroid dependent   . Pneumonia   . Osteoarthritis   . Anxiety   . Anemia     Medications:  Anti-infectives    Start     Dose/Rate Route Frequency Ordered Stop   03/21/14 1100  levofloxacin (LEVAQUIN) IVPB 750 mg     750 mg100 mL/hr over 90 Minutes Intravenous Every 24 hours September 27, 2013 1624     September 27, 2013 1700  levofloxacin (LEVAQUIN) IVPB 750 mg  Status:  Discontinued     750 mg100 mL/hr over 90 Minutes Intravenous Every 24 hours September 27, 2013 1537 September 27, 2013 1624   September 27, 2013 1700  aztreonam (AZACTAM) 2 g in dextrose 5 % 50 mL IVPB     2 g100 mL/hr over 30 Minutes Intravenous Every 8 hours September 27, 2013 1537       Assessment: 64 year old male [facility] with refractory shock and CAP to continue Levaquin and start Aztreonam. Per [facility] documentation, patient received Levaquin 750mg  at 11:13 AM today.   ID: WBC 9.4, SCr 0.80/ normalized CrCl ~95-17700mL/min, Temp 97, on pressors with cultures pending. Unsure [facility]. Patient has PCN  allergy.   Goal of Therapy:  Clinical resolution of infection  Plan:  Continue Levaquin 750mg  IV q24h - next dose 12/17 at 1100AM. Continue Aztreonam 2g IV q8h. Monitor renal function, clinical status, and culture results.   Link SnufferJessica Thyra Yinger, PharmD, BCPS Clinical Pharmacist [phone] 05/30/13,4:03 PM

## 2014-04-05 NOTE — Progress Notes (Signed)
Patient is alert. Temp 95.8, bp 63/46 map 50 with levophed gtt, fentanyl gtt infusing. Foley inserted and bare hugger started. Patient is intubated.

## 2014-04-05 NOTE — Progress Notes (Signed)
Patient not responding to vasopressors or fluids. Bp remains very low. Family is at bedside. Family spoke with MD in detail about patient's plan of care. Patient continues to decline. Doctor in the box called and informed of patient condition. Patient's sister given phone to speak with Dr.McQuaid on how to proceed with patient care. Sister has decided to progress to comfort care.

## 2014-04-05 DEATH — deceased

## 2016-08-02 IMAGING — CR DG CHEST 1V PORT
2 series · 2 of 2 positions shown · non-contrast
Comparison: 03/20/2014

CLINICAL DATA: Severe COPD, pneumonia

EXAM:
PORTABLE CHEST - 1 VIEW

[AP (1 of 2)]
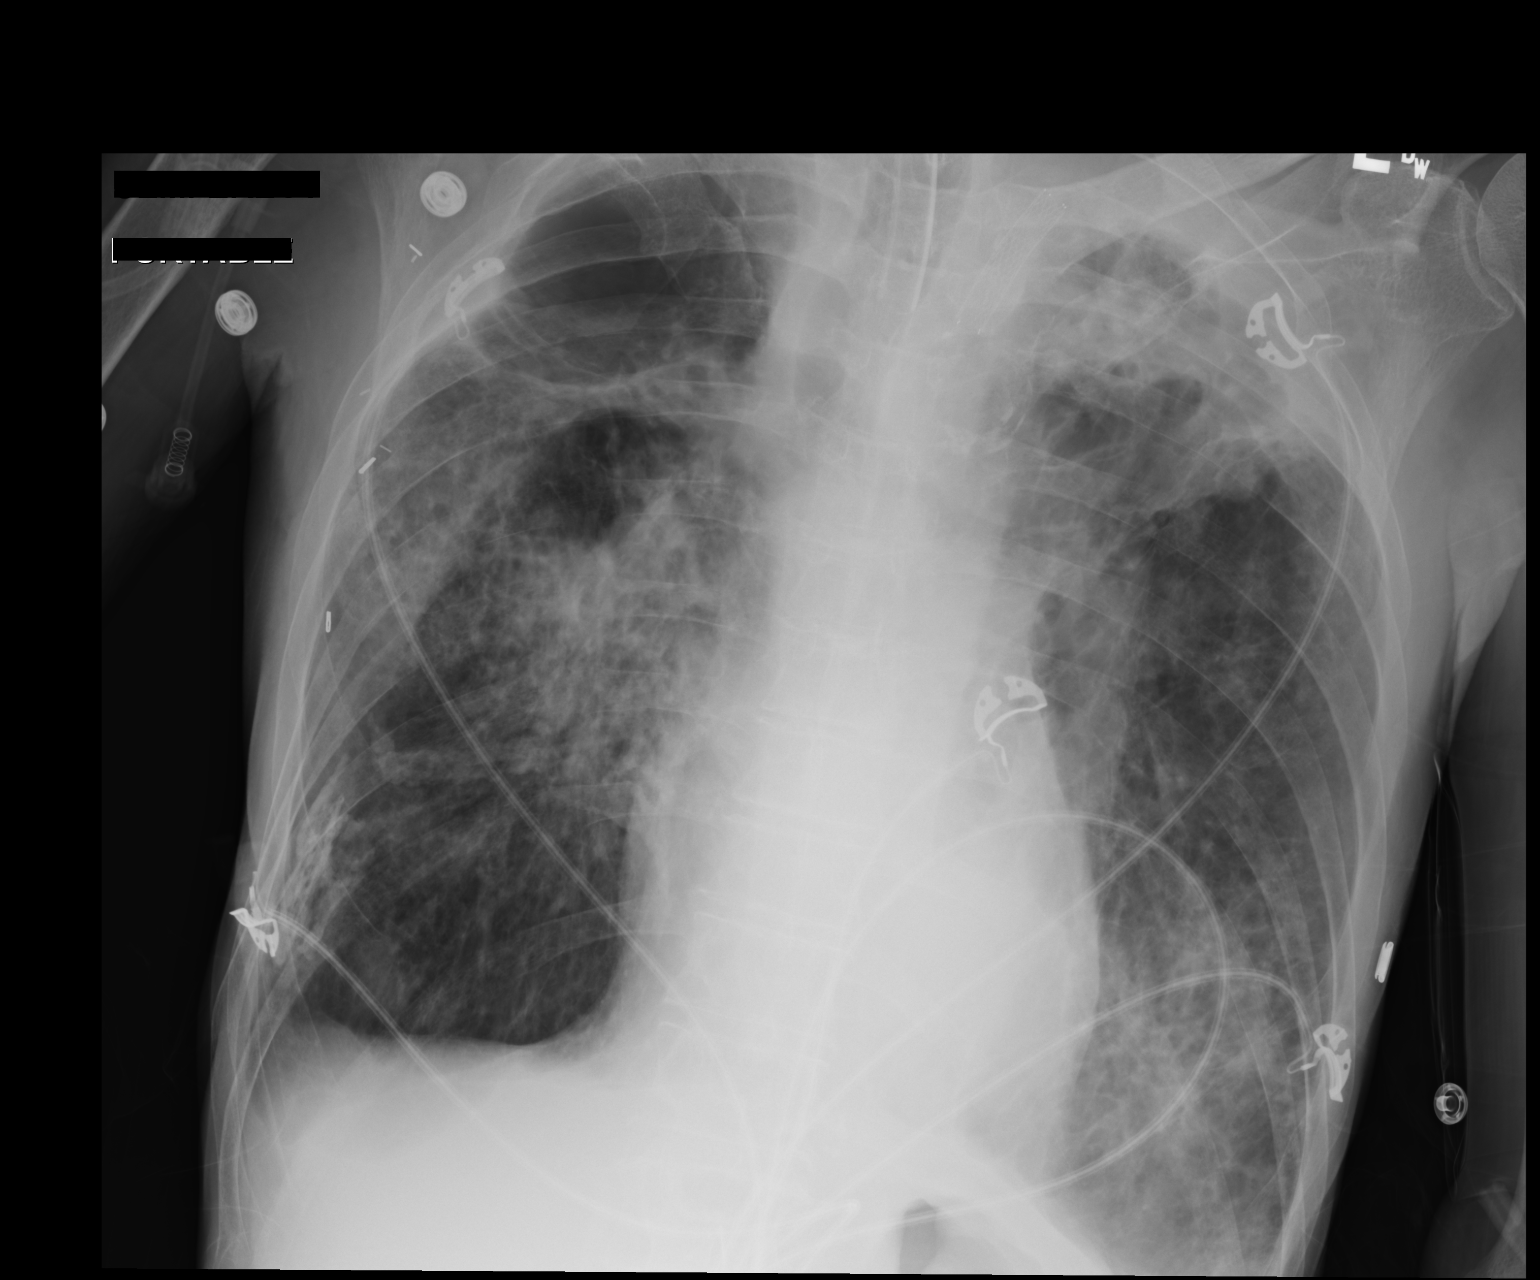

[AP (2 of 2)]
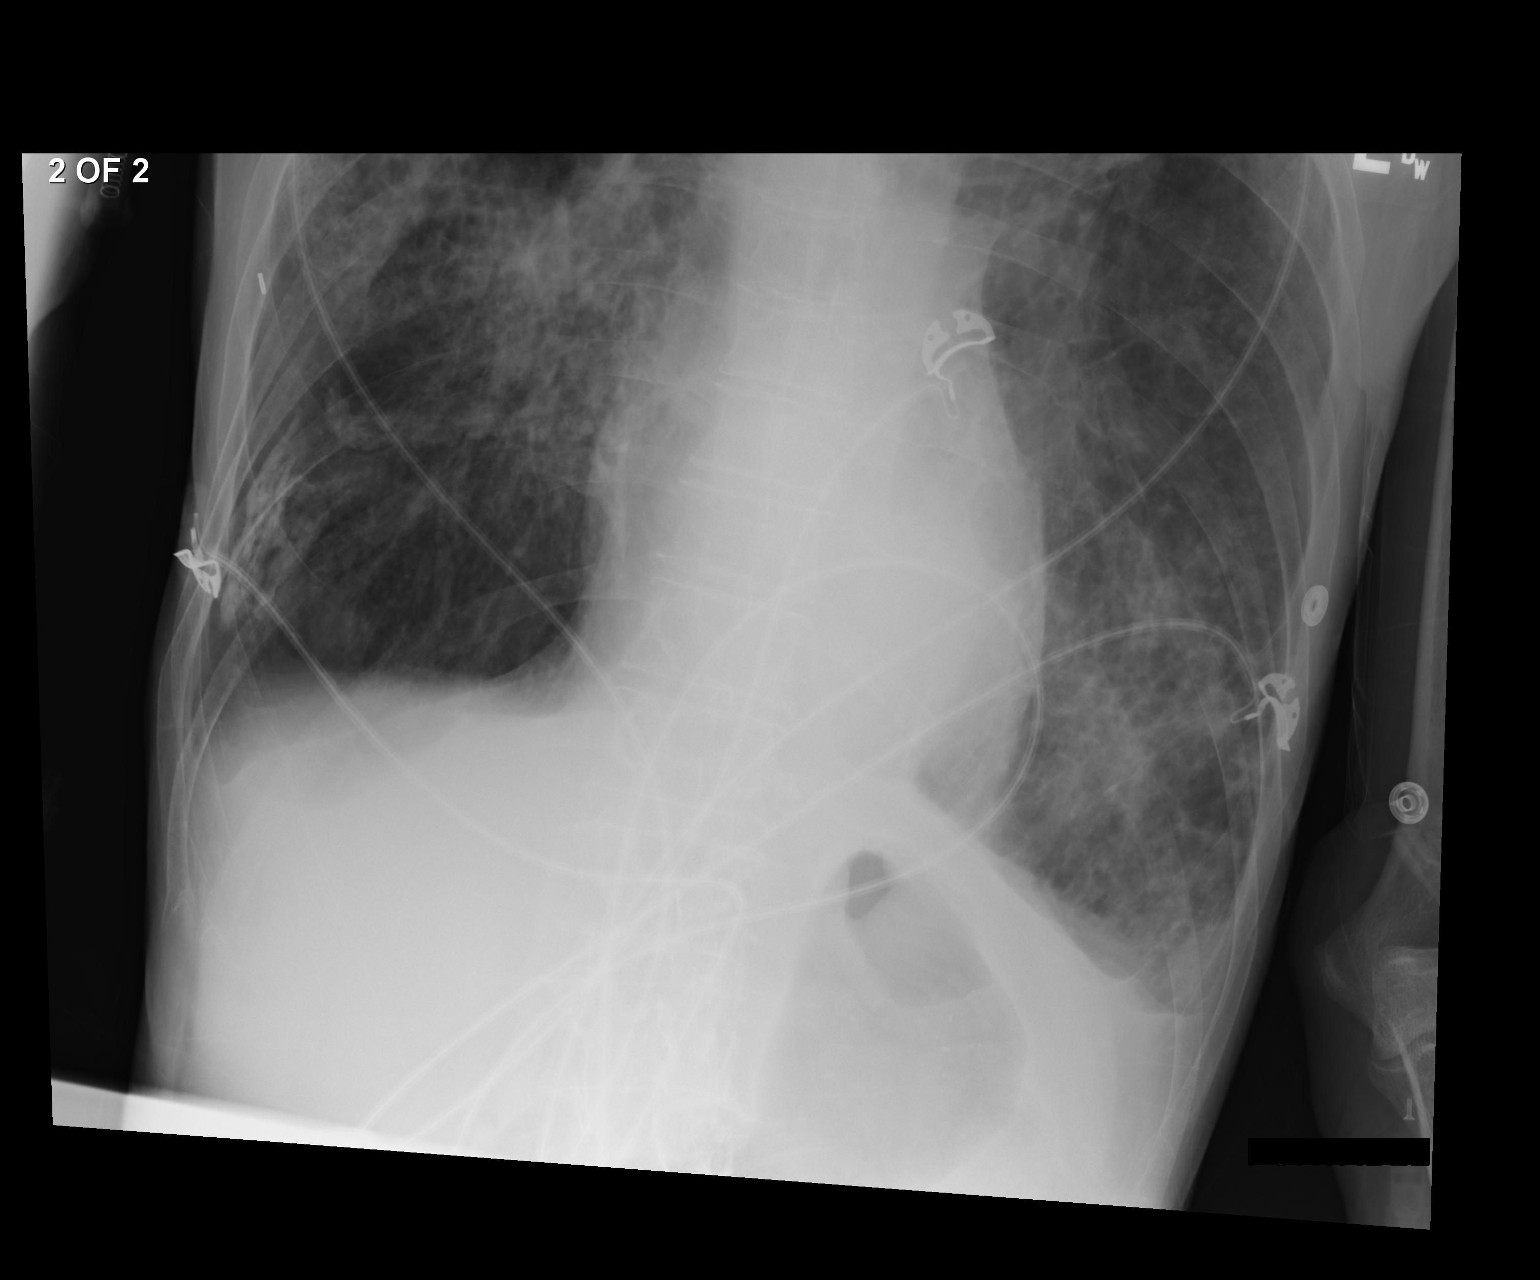

[2 of 2 positions shown; findings below may reference images not displayed]

FINDINGS: Cardiomediastinal silhouette is stable. Significant emphysematous
changes with superimposed patchy pneumonia right midlung and left
lower again noted. Stable scarring in left upper lobe. Significant
bullous changes in right upper lobe again noted. Endotracheal tube
in place with tip 3.9 cm above the carina. Probable small left
pleural effusion.
IMPRESSION: Significant emphysematous changes with superimposed patchy pneumonia
right midlung and left lower again noted. Stable scarring in left
upper lobe. Significant bullous changes in right upper lobe again
noted. Endotracheal tube in place with tip 3.9 cm above the carina.
